# Patient Record
Sex: Male | Born: 1995 | Race: White | Hispanic: No | Marital: Single | Smoking: Never smoker
Health system: Southern US, Community
[De-identification: ages and names within clinical notes are randomized; demographics above are authoritative.]

## PROBLEM LIST (undated history)

## (undated) HISTORY — PX: OTHER SURGICAL HISTORY: SHX169

---
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
## 2014-10-25 DIAGNOSIS — R1031 Right lower quadrant pain: Secondary | ICD-10-CM | POA: Insufficient documentation

## 2014-10-26 ENCOUNTER — Encounter (HOSPITAL_COMMUNITY): Payer: Self-pay

## 2014-10-26 ENCOUNTER — Emergency Department (HOSPITAL_COMMUNITY)
Admission: EM | Admit: 2014-10-26 | Discharge: 2014-10-26 | Disposition: A | Discharge status: Home / Self Care | Payer: Self-pay | Attending: Emergency Medicine | Admitting: Emergency Medicine

## 2014-10-26 ENCOUNTER — Emergency Department (HOSPITAL_COMMUNITY): Payer: Self-pay

## 2014-10-26 DIAGNOSIS — R109 Unspecified abdominal pain: Secondary | ICD-10-CM

## 2014-10-26 LAB — CBC WITH DIFFERENTIAL/PLATELET
BASOS ABS: 0 10*3/uL (ref 0.0–0.1)
Basophils Relative: 0 %
EOS PCT: 1 %
Eosinophils Absolute: 0.2 10*3/uL (ref 0.0–0.7)
HCT: 44.9 % (ref 39.0–52.0)
Hemoglobin: 15.5 g/dL (ref 13.0–17.0)
LYMPHS PCT: 11 %
Lymphs Abs: 1.8 10*3/uL (ref 0.7–4.0)
MCH: 31.6 pg (ref 26.0–34.0)
MCHC: 34.5 g/dL (ref 30.0–36.0)
MCV: 91.4 fL (ref 78.0–100.0)
Monocytes Absolute: 1.2 10*3/uL — ABNORMAL HIGH (ref 0.1–1.0)
Monocytes Relative: 7 %
Neutro Abs: 13.2 10*3/uL — ABNORMAL HIGH (ref 1.7–7.7)
Neutrophils Relative %: 81 %
PLATELETS: 251 10*3/uL (ref 150–400)
RBC: 4.91 MIL/uL (ref 4.22–5.81)
RDW: 12.5 % (ref 11.5–15.5)
WBC: 16.4 10*3/uL — ABNORMAL HIGH (ref 4.0–10.5)

## 2014-10-26 LAB — I-STAT CHEM 8, ED
BUN: 15 mg/dL (ref 6–20)
CHLORIDE: 99 mmol/L — AB (ref 101–111)
Calcium, Ion: 1.22 mmol/L (ref 1.12–1.23)
Creatinine, Ser: 1.1 mg/dL (ref 0.61–1.24)
Glucose, Bld: 119 mg/dL — ABNORMAL HIGH (ref 65–99)
HCT: 49 % (ref 39.0–52.0)
Hemoglobin: 16.7 g/dL (ref 13.0–17.0)
POTASSIUM: 4.8 mmol/L (ref 3.5–5.1)
Sodium: 138 mmol/L (ref 135–145)
TCO2: 31 mmol/L (ref 0–100)

## 2014-10-26 LAB — URINALYSIS, ROUTINE W REFLEX MICROSCOPIC
Bilirubin Urine: NEGATIVE
Glucose, UA: NEGATIVE mg/dL
Hgb urine dipstick: NEGATIVE
KETONES UR: NEGATIVE mg/dL
LEUKOCYTES UA: NEGATIVE
NITRITE: NEGATIVE
PROTEIN: NEGATIVE mg/dL
Specific Gravity, Urine: 1.023 (ref 1.005–1.030)
UROBILINOGEN UA: 1 mg/dL (ref 0.0–1.0)
pH: 6.5 (ref 5.0–8.0)

## 2014-10-26 LAB — AMYLASE: AMYLASE: 91 U/L (ref 28–100)

## 2014-10-26 LAB — LIPASE, BLOOD: LIPASE: 18 U/L — AB (ref 22–51)

## 2014-10-26 MED ORDER — IOHEXOL 300 MG/ML  SOLN
50.0000 mL | Freq: Once | INTRAMUSCULAR | Status: AC | PRN
  Administered 2014-10-26: 50 mL via ORAL

## 2014-10-26 MED ORDER — SODIUM CHLORIDE 0.9 % IV SOLN
1000.0000 mL | INTRAVENOUS | Status: DC
Start: 2014-10-26 — End: 2014-10-26
  Administered 2014-10-26: 1000 mL via INTRAVENOUS

## 2014-10-26 MED ORDER — SODIUM CHLORIDE 0.9 % IV SOLN
1000.0000 mL | Freq: Once | INTRAVENOUS | Status: AC
  Administered 2014-10-26: 1000 mL via INTRAVENOUS

## 2014-10-26 MED ORDER — IOHEXOL 300 MG/ML  SOLN
100.0000 mL | Freq: Once | INTRAMUSCULAR | Status: AC | PRN
  Administered 2014-10-26: 100 mL via INTRAVENOUS

## 2014-10-26 MED ORDER — ONDANSETRON 8 MG PO TBDP: 8.0000 mg | ORAL_TABLET | Freq: Three times a day (TID) | ORAL | Status: AC | PRN

## 2014-10-26 MED ORDER — DICYCLOMINE HCL 20 MG PO TABS: 20.0000 mg | ORAL_TABLET | Freq: Three times a day (TID) | ORAL | Status: AC | PRN

## 2014-10-26 MED ORDER — MORPHINE SULFATE (PF) 4 MG/ML IV SOLN
4.0000 mg | INTRAVENOUS | Status: DC | PRN
  Administered 2014-10-26: 4 mg via INTRAVENOUS
  Filled 2014-10-26: qty 1

## 2014-10-26 NOTE — ED Provider Notes (Signed)
CSN: [id number]     Arrival date & time 10/25/14  2353 History  This chart was scribed for Azalia Bilis, MD by Budd Palmer, ED Scribe. This patient was seen in room WA15/WA15 and the patient's care was started at 2:46 AM.    Chief Complaint  Patient presents with  . Abdominal Pain   The history is provided by the patient. No language interpreter was used.   HPI Comments: Jeffrey Harvey is a 19 y.o. male who presents to the Emergency Department complaining of sharp, burning, intermittent RLQ abdominal pain onset at 6 PM. He currently rates his pain as a 1/10. He reports associated nausea. He notes a previous episode 2 weeks ago of the same where he vomited, had diarrhea, and then resolved after 3 hours. Pt denies any FHx of inflammatory bowel disease . He has not found any triggers for this pain. Pt denies fever and urinary symptoms.  History reviewed. No pertinent past medical history. History reviewed. No pertinent past surgical history. History reviewed. No pertinent family history. Social History  Substance Use Topics  . Smoking status: Never Smoker   . Smokeless tobacco: None  . Alcohol Use: No    Review of Systems  A complete 10 system review of systems was obtained and all systems are negative except as noted in the HPI and PMH.    Allergies  Review of patient's allergies indicates not on file.  Home Medications   Prior to Admission medications   Not on File   BP 124/69 mmHg  Pulse 70  Temp(Src) 98 F (36.7 C) (Oral)  Resp 16  SpO2 100% Physical Exam  Constitutional: He is oriented to person, place, and time. He appears well-developed and well-nourished.  HENT:  Head: Normocephalic and atraumatic.  Eyes: EOM are normal.  Neck: Normal range of motion.  Cardiovascular: Normal rate, regular rhythm, normal heart sounds and intact distal pulses.   Pulmonary/Chest: Effort normal and breath sounds normal. No respiratory distress.  Abdominal: Soft. He exhibits no  distension. There is tenderness.  Mild suprapubic and RLQ TTP  Musculoskeletal: Normal range of motion.  Neurological: He is alert and oriented to person, place, and time.  Skin: Skin is warm and dry.  Psychiatric: He has a normal mood and affect. Judgment normal.  Nursing note and vitals reviewed.   ED Course  Procedures  DIAGNOSTIC STUDIES: Oxygen Saturation is 100% on RA, normal by my interpretation.    COORDINATION OF CARE: 2:51 AM - Discussed plans to order diagnostic imaging. Pt advised of plan for treatment and pt agrees.  Labs Review Labs Reviewed  CBC WITH DIFFERENTIAL/PLATELET - Abnormal; Notable for the following:    WBC 16.4 (*)    Neutro Abs 13.2 (*)    Monocytes Absolute 1.2 (*)    All other components within normal limits  LIPASE, BLOOD - Abnormal; Notable for the following:    Lipase 18 (*)    All other components within normal limits  I-STAT CHEM 8, ED - Abnormal; Notable for the following:    Chloride 99 (*)    Glucose, Bld 119 (*)    All other components within normal limits  AMYLASE  URINALYSIS, ROUTINE W REFLEX MICROSCOPIC (NOT AT [facility])    Imaging Review Ct Abdomen Pelvis W Contrast  10/26/2014   CLINICAL DATA:  Right lower quadrant pain, nausea, leukocytosis  EXAM: CT ABDOMEN AND PELVIS WITH CONTRAST  TECHNIQUE: Multidetector CT imaging of the abdomen and pelvis was performed using the standard protocol  following bolus administration of intravenous contrast.  CONTRAST:  OMNIPAQUE IOHEXOL 300 MG/ML  SOLN  COMPARISON:  None.  FINDINGS: Lower chest:  Lung bases are clear.  Hepatobiliary: Liver is within normal limits. No suspicious/enhancing hepatic lesions.  Gallbladder is unremarkable. No intrahepatic or extrahepatic ductal dilatation.  Pancreas: Within normal limits.  Spleen: Within normal limits.  Adrenals/Urinary Tract: Adrenal glands are within normal limits.  Kidneys are within normal limits.  No hydronephrosis.  Bladder is within normal limits.   Stomach/Bowel: Stomach is within normal limits.  No evidence of bowel obstruction.  Appendix is notable for mild mucosal hyperenhancement of the wall (coronal image 46) without associated inflammatory changes.  Moderate colonic stool burden.  Vascular/Lymphatic: No evidence of abdominal aortic aneurysm.  No suspicious abdominopelvic lymphadenopathy.  Reproductive: Prostate is unremarkable.  Other: Small volume pelvic ascites.  Musculoskeletal: Visualized osseous structures are within normal limits.  IMPRESSION: No evidence of bowel obstruction.  No evidence of acute appendicitis.  Small volume pelvic ascites, nonspecific.   Electronically Signed   By: Charline Bills M.D.   On: 10/26/2014 06:23   I have personally reviewed and evaluated these images and lab results as part of my medical decision-making.   EKG Interpretation None      MDM   Final diagnoses:  None    Outpatient GI follow-up as the patient may benefit from outpatient colonoscopy given his ongoing intermittent lower abdominal pain.  No urinary symptoms.  Well-appearing.  Vital signs normal.  I personally performed the services described in this documentation, which was scribed in my presence. The recorded information has been reviewed and is accurate.      Azalia Bilis, MD 10/26/14 [phone]

## 2014-10-26 NOTE — ED Notes (Signed)
Pt complains of sharp abd pains in his stomach tonight, no vomiting or diarrhea

## 2016-04-04 IMAGING — CT CT ABD-PELV W/ CM
2 of 4 series · 16 of 46 positions shown, 18 images · IV contrast (omnipaque)
Comparison: None.

CLINICAL DATA: Right lower quadrant pain, nausea, leukocytosis

EXAM:
CT ABDOMEN AND PELVIS WITH CONTRAST
TECHNIQUE: Multidetector CT imaging of the abdomen and pelvis was performed
using the standard protocol following bolus administration of
intravenous contrast.
CONTRAST:  100mL OMNIPAQUE IOHEXOL 300 MG/ML  SOLN

[Series 2: abd/pel with · axial · 0.70mm/px · z∈[+1018,+1403]mm · 13 of 85 slices shown, 15 images]
[im 4/85  soft-tissue]
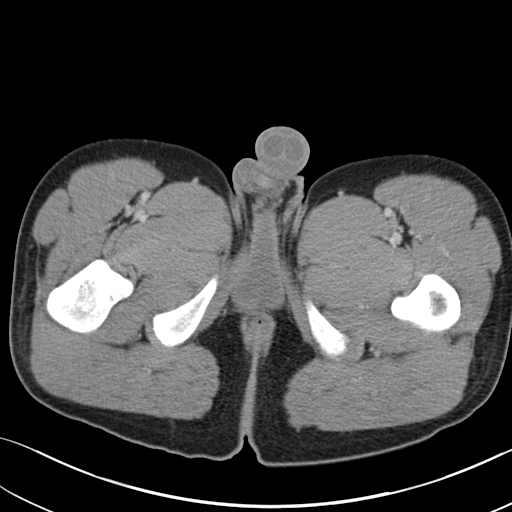
[im 4/85  bone]
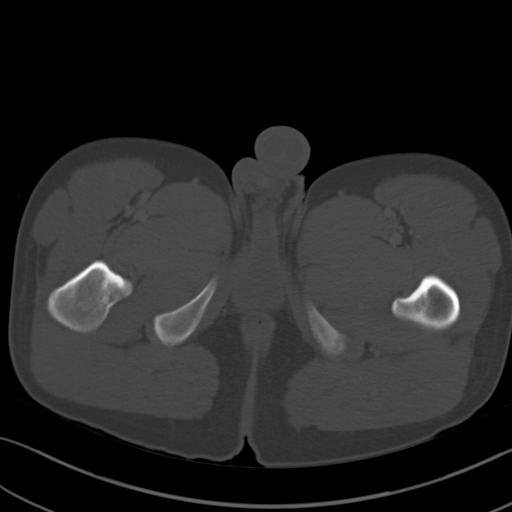
[im 10/85  soft-tissue]
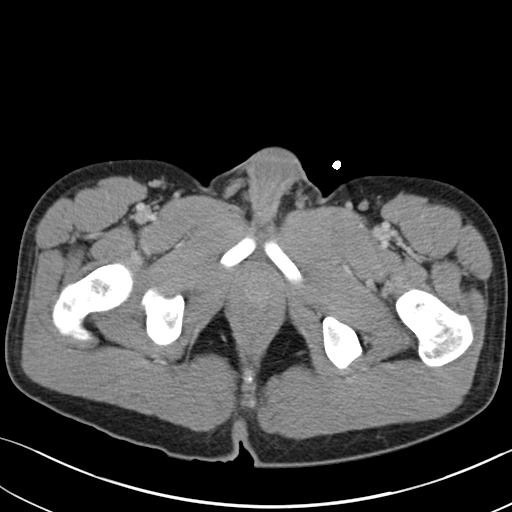
[im 17/85  soft-tissue]
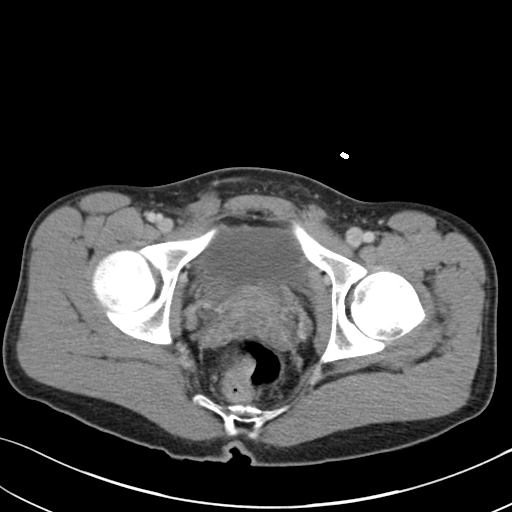
[im 23/85  soft-tissue]
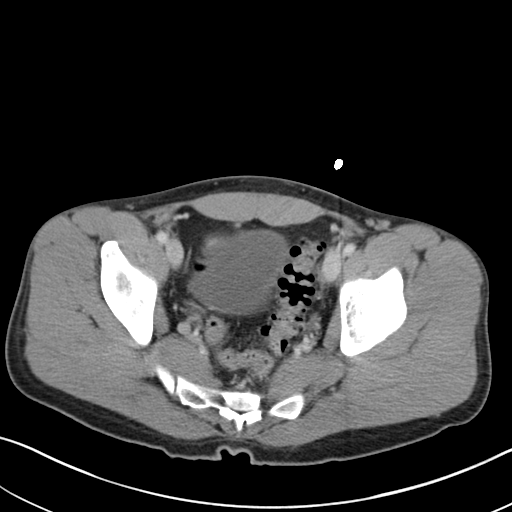
[im 30/85  soft-tissue]
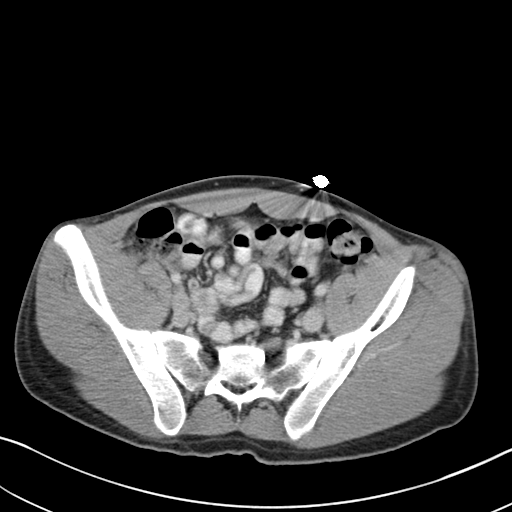
[im 36/85  soft-tissue]
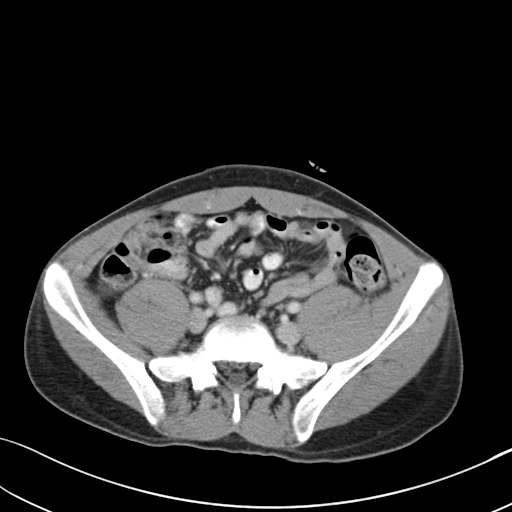
[im 43/85  soft-tissue]
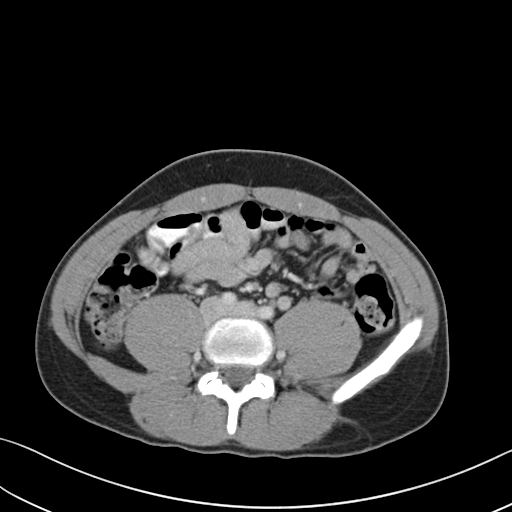
[im 49/85  soft-tissue]
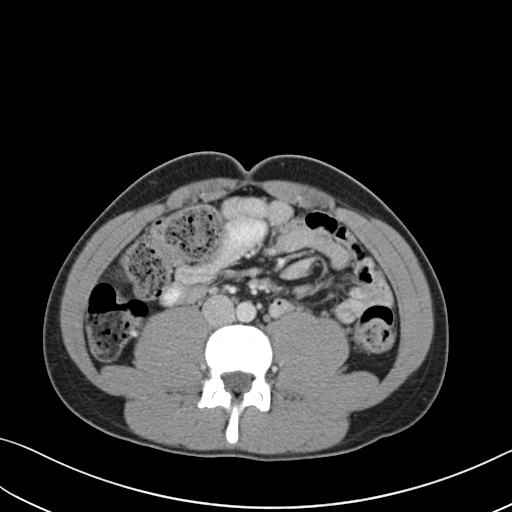
[im 55/85  soft-tissue]
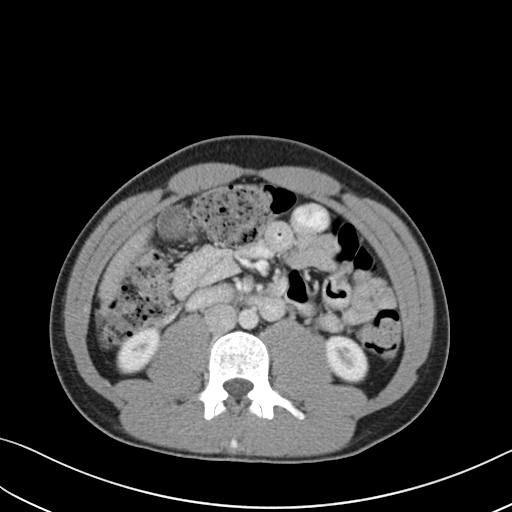
[im 55/85  bone]
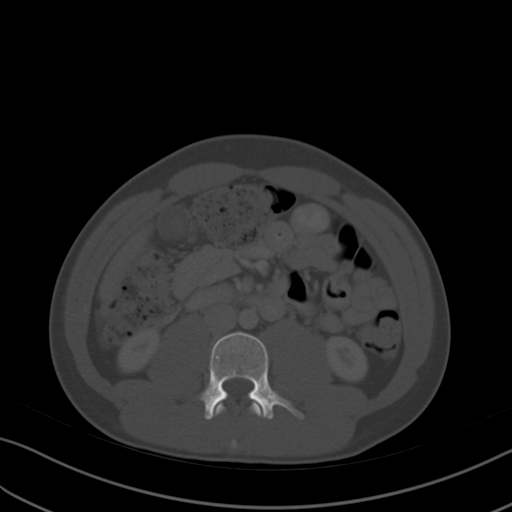
[im 62/85  soft-tissue]
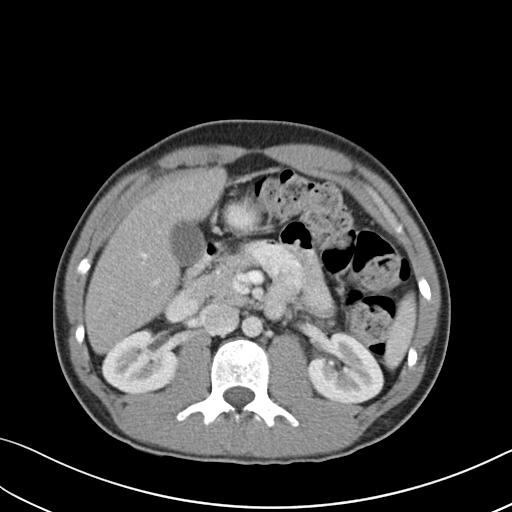
[im 68/85  soft-tissue]
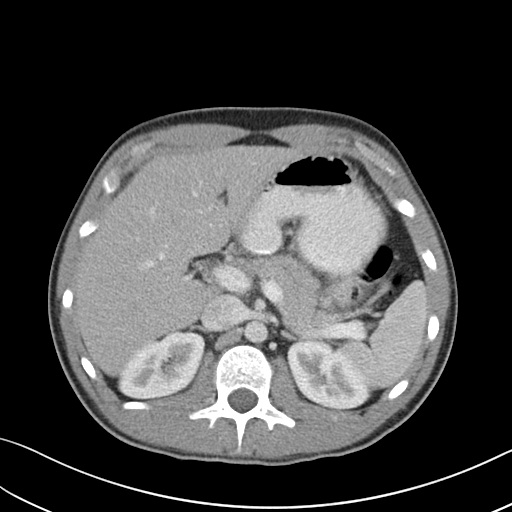
[im 75/85  soft-tissue]
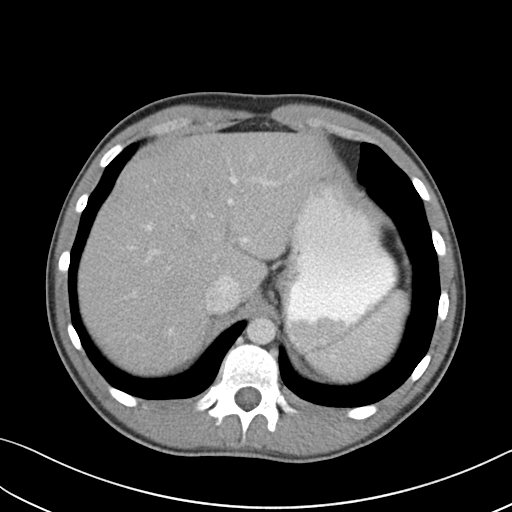
[im 81/85  soft-tissue]
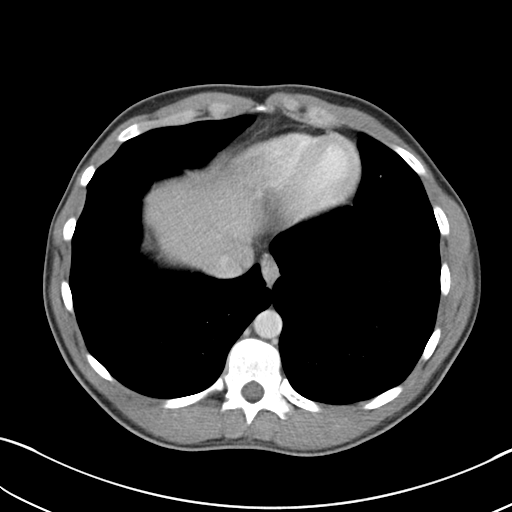

[Series 4: coronal a/|p · coronal · 0.62mm/px · 3 of 86 slices shown]
[im 29/86  soft-tissue]
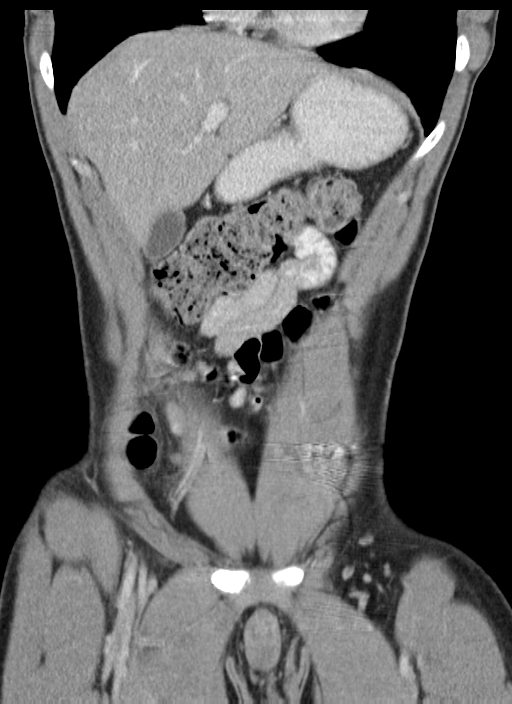
[im 38/86  soft-tissue]
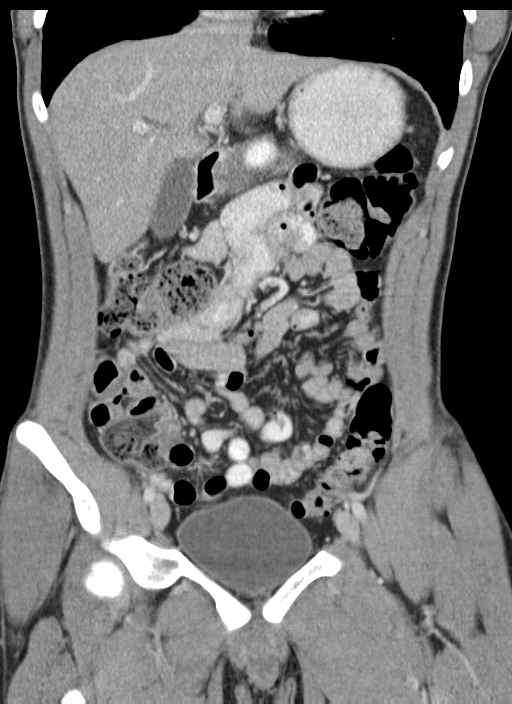
[im 48/86  soft-tissue]
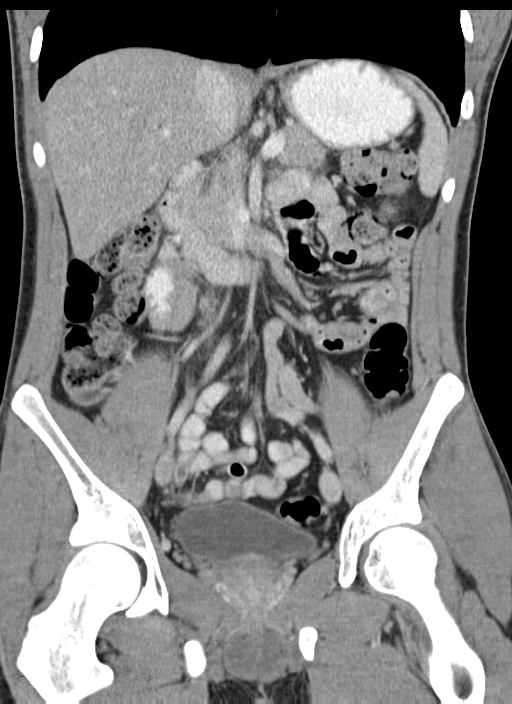

[16 of 46 positions shown; findings below may reference images not displayed]

FINDINGS: Lower chest:  Lung bases are clear.

Hepatobiliary: Liver is within normal limits. No
suspicious/enhancing hepatic lesions.

Gallbladder is unremarkable. No intrahepatic or extrahepatic ductal
dilatation.

Pancreas: Within normal limits.

Spleen: Within normal limits.

Adrenals/Urinary Tract: Adrenal glands are within normal limits.

Kidneys are within normal limits.  No hydronephrosis.

Bladder is within normal limits.

Stomach/Bowel: Stomach is within normal limits.

No evidence of bowel obstruction.

Appendix is notable for mild mucosal hyperenhancement of the wall
(coronal image 46) without associated inflammatory changes.

Moderate colonic stool burden.

Vascular/Lymphatic: No evidence of abdominal aortic aneurysm.

No suspicious abdominopelvic lymphadenopathy.

Reproductive: Prostate is unremarkable.

Other: Small volume pelvic ascites.

Musculoskeletal: Visualized osseous structures are within normal
limits.
IMPRESSION: No evidence of bowel obstruction.

No evidence of acute appendicitis.

Small volume pelvic ascites, nonspecific.

## 2016-06-06 ENCOUNTER — Emergency Department (HOSPITAL_COMMUNITY)
Admission: EM | Admit: 2016-06-06 | Discharge: 2016-06-06 | Disposition: A | Discharge status: Home / Self Care | Payer: Self-pay | Attending: Emergency Medicine | Admitting: Emergency Medicine

## 2016-06-06 ENCOUNTER — Encounter (HOSPITAL_COMMUNITY): Payer: Self-pay | Admitting: *Deleted

## 2016-06-06 DIAGNOSIS — R112 Nausea with vomiting, unspecified: Secondary | ICD-10-CM | POA: Insufficient documentation

## 2016-06-06 DIAGNOSIS — R1031 Right lower quadrant pain: Secondary | ICD-10-CM | POA: Insufficient documentation

## 2016-06-06 DIAGNOSIS — R1030 Lower abdominal pain, unspecified: Secondary | ICD-10-CM

## 2016-06-06 LAB — URINALYSIS, ROUTINE W REFLEX MICROSCOPIC
BACTERIA UA: NONE SEEN
Bilirubin Urine: NEGATIVE
GLUCOSE, UA: NEGATIVE mg/dL
Hgb urine dipstick: NEGATIVE
Ketones, ur: 20 mg/dL — AB
Leukocytes, UA: NEGATIVE
Nitrite: NEGATIVE
PROTEIN: 100 mg/dL — AB
Specific Gravity, Urine: 1.028 (ref 1.005–1.030)
Squamous Epithelial / LPF: NONE SEEN
pH: 8 (ref 5.0–8.0)

## 2016-06-06 LAB — CBC
HEMATOCRIT: 47.5 % (ref 39.0–52.0)
Hemoglobin: 16.8 g/dL (ref 13.0–17.0)
MCH: 31.6 pg (ref 26.0–34.0)
MCHC: 35.4 g/dL (ref 30.0–36.0)
MCV: 89.5 fL (ref 78.0–100.0)
Platelets: 239 10*3/uL (ref 150–400)
RBC: 5.31 MIL/uL (ref 4.22–5.81)
RDW: 12.5 % (ref 11.5–15.5)
WBC: 15.4 10*3/uL — ABNORMAL HIGH (ref 4.0–10.5)

## 2016-06-06 LAB — COMPREHENSIVE METABOLIC PANEL
ALBUMIN: 5 g/dL (ref 3.5–5.0)
ALK PHOS: 83 U/L (ref 38–126)
ALT: 83 U/L — ABNORMAL HIGH (ref 17–63)
AST: 53 U/L — AB (ref 15–41)
Anion gap: 12 (ref 5–15)
BILIRUBIN TOTAL: 1.3 mg/dL — AB (ref 0.3–1.2)
BUN: 14 mg/dL (ref 6–20)
CALCIUM: 10.4 mg/dL — AB (ref 8.9–10.3)
CO2: 22 mmol/L (ref 22–32)
Chloride: 102 mmol/L (ref 101–111)
Creatinine, Ser: 1.26 mg/dL — ABNORMAL HIGH (ref 0.61–1.24)
GFR calc Af Amer: >60 mL/min (ref >60)
GFR calc non Af Amer: >60 mL/min (ref >60)
GLUCOSE: 145 mg/dL — AB (ref 65–99)
POTASSIUM: 4 mmol/L (ref 3.5–5.1)
Sodium: 136 mmol/L (ref 135–145)
Total Protein: 8.1 g/dL (ref 6.5–8.1)

## 2016-06-06 LAB — LIPASE, BLOOD: Lipase: 19 U/L (ref 11–51)

## 2016-06-06 MED ORDER — ONDANSETRON 4 MG PO TBDP
ORAL_TABLET | ORAL | Status: AC
  Filled 2016-06-06: qty 1

## 2016-06-06 MED ORDER — SODIUM CHLORIDE 0.9 % IV BOLUS (SEPSIS)
1000.0000 mL | Freq: Once | INTRAVENOUS | Status: AC
  Administered 2016-06-06: 1000 mL via INTRAVENOUS

## 2016-06-06 MED ORDER — ONDANSETRON 4 MG PO TBDP
4.0000 mg | ORAL_TABLET | Freq: Once | ORAL | Status: AC | PRN
  Administered 2016-06-06: 4 mg via ORAL

## 2016-06-06 NOTE — ED Triage Notes (Signed)
Pt reprots waking up this am with severe abd pain and n/v. Denies diarrhea. Reports syncopal episode after vomiting.

## 2016-06-06 NOTE — Discharge Instructions (Signed)
Increase hydration.   Call the GI clinic today or tomorrow morning to schedule a follow up appointment.   Please seek immediate care if you develop any of the following symptoms: The pain does not go away.  You have a fever.  You keep throwing up and can't keep fluids down. You pass bloody or black tarry stools.  There is bright red blood in the stool.  There is rectal pain.  You do not seem to be getting better.  You have any questions or concerns.

## 2016-06-06 NOTE — ED Provider Notes (Signed)
[facility]-EMERGENCY DEPT Provider Note   CSN: [id number] Arrival date & time: 06/06/16  4098     History   Chief Complaint Chief Complaint  Patient presents with  . Emesis    HPI Jeffrey Harvey is a 21 y.o. male.  The history is provided by the patient and medical records. No language interpreter was used.   Jeffrey Harvey is an otherwise healthy 21 y.o. male who presents to the Emergency Department complaining of right-sided sharp abdominal pain associated with nausea and vomiting which began today. Patient states pain awoke him from his sleep today around 2am. He had several episodes of emesis and felt nauseous all morning. Throughout the morning, pain slowly improved. Took no medications at home prior to arrival, however was given Zofran in triage. Upon my evaluation, patient states that pain has now completely resolved after Zofran administration. He is no longer nauseous. No fevers, chest pain, cough, congestion. No diarrhea, constipation, blood in stool. No sick contacts or recent traveling. History of similar episodes a few years ago where he received CT scan and was told everything was fine. He was instructed to follow up with GI, however symptoms resolved and therefore he did not follow up.   History reviewed. No pertinent past medical history.  There are no active problems to display for this patient.   History reviewed. No pertinent surgical history.     Home Medications    Prior to Admission medications   Medication Sig Start Date End Date Taking? Authorizing Provider  acetaminophen (TYLENOL) 500 MG tablet Take 1,000 mg by mouth every 6 (six) hours as needed (for pain.).    Historical Provider, MD  dicyclomine (BENTYL) 20 MG tablet Take 1 tablet (20 mg total) by mouth every 8 (eight) hours as needed for spasms. Patient not taking: Reported on 06/06/2016 10/26/14   Azalia Bilis, MD  ondansetron [facility] ODT) 8 MG disintegrating tablet Take 1 tablet (8 mg total) by  mouth every 8 (eight) hours as needed for nausea or vomiting. Patient not taking: Reported on 06/06/2016 10/26/14   Azalia Bilis, MD    Family History History reviewed. No pertinent family history.  Social History Social History  Substance Use Topics  . Smoking status: Never Smoker  . Smokeless tobacco: Not on file  . Alcohol use No     Allergies   Lactose intolerance (gi)   Review of Systems Review of Systems  Gastrointestinal: Positive for abdominal pain, nausea and vomiting. Negative for blood in stool, constipation and diarrhea.  All other systems reviewed and are negative.    Physical Exam Updated Vital Signs BP 124/82 (BP Location: Right Arm)   Pulse 75   Temp 97.8 F (36.6 C) (Oral)   Resp 16   SpO2 96%   Physical Exam  Constitutional: He is oriented to person, place, and time. He appears well-developed and well-nourished. No distress.  HENT:  Head: Normocephalic and atraumatic.  Tacky mucous membranes.  Cardiovascular: Normal rate, regular rhythm and normal heart sounds.   No murmur heard. Pulmonary/Chest: Effort normal and breath sounds normal. No respiratory distress.  Abdominal: Soft. Bowel sounds are normal. He exhibits no distension.  No abdominal or CVA tenderness.  Musculoskeletal: Normal range of motion.  Neurological: He is alert and oriented to person, place, and time.  Skin: Skin is warm and dry.  Good cap refill.  Nursing note and vitals reviewed.    ED Treatments / Results  Labs (all labs ordered are listed, but only abnormal results are  displayed) Labs Reviewed  COMPREHENSIVE METABOLIC PANEL - Abnormal; Notable for the following:       Result Value   Glucose, Bld 145 (*)    Creatinine, Ser 1.26 (*)    Calcium 10.4 (*)    AST 53 (*)    ALT 83 (*)    Total Bilirubin 1.3 (*)    All other components within normal limits  CBC - Abnormal; Notable for the following:    WBC 15.4 (*)    All other components within normal limits    URINALYSIS, ROUTINE W REFLEX MICROSCOPIC - Abnormal; Notable for the following:    Color, Urine AMBER (*)    APPearance HAZY (*)    Ketones, ur 20 (*)    Protein, ur 100 (*)    All other components within normal limits  LIPASE, BLOOD    EKG  EKG Interpretation None       Radiology No results found.  Procedures Procedures (including critical care time)  Medications Ordered in ED Medications  ondansetron (ZOFRAN-ODT) 4 MG disintegrating tablet (not administered)  ondansetron (ZOFRAN-ODT) disintegrating tablet 4 mg (4 mg Oral Given 06/06/16 0934)  sodium chloride 0.9 % bolus 1,000 mL (0 mLs Intravenous Stopped 06/06/16 1252)     Initial Impression / Assessment and Plan / ED Course  I have reviewed the triage vital signs and the nursing notes.  Pertinent labs & imaging results that were available during my care of the patient were reviewed by me and considered in my medical decision making (see chart for details).    Jeffrey Harvey is a 21 y.o. male who presents to ED for right sided abdominal pain which began around 2 am. Associated with n/v. Patient notes all symptoms have resolved just after zofran administration. Benign abdominal exam. Does appear mildly dehydrated. 1L IVF given. Labs reviewed with attending and notable for mild bump in AST/ALT/Bili as well as leukocytosis of 15.4. Cr up to 1.26 - suspect this is due to dehydration.   Patient re-evaluated following fluids. Repeat abdominal exam still benign with no tenderness whatsoever. Patient feels improved with no return of symptoms, nausea or vomiting. Eating graham crackers and peanut butter with no difficulty. He has history of very similar incident 2 years ago where CT scan was performed and negative. He will have intermittent symptoms similar to this, but not as severe. Strongly encouraged GI follow up. Spoke to patient as well as mother and significant other about return precautions. They feel comfortable with plan  and understand reasons to return. All questions answered.    Patient discussed with Dr. Patria Mane who agrees with treatment plan.    Final Clinical Impressions(s) / ED Diagnoses   Final diagnoses:  Lower abdominal pain    New Prescriptions New Prescriptions   No medications on file     [facility] Ward, PA-C 06/06/16 1313    Azalia Bilis, MD 06/06/16 1539

## 2018-10-26 ENCOUNTER — Other Ambulatory Visit: Payer: Self-pay

## 2018-10-26 ENCOUNTER — Emergency Department (HOSPITAL_COMMUNITY)
Admission: EM | Admit: 2018-10-26 | Discharge: 2018-10-26 | Disposition: A | Discharge status: Home / Self Care | Payer: Self-pay | Attending: Emergency Medicine | Admitting: Emergency Medicine

## 2018-10-26 DIAGNOSIS — R3989 Other symptoms and signs involving the genitourinary system: Secondary | ICD-10-CM | POA: Insufficient documentation

## 2018-10-26 DIAGNOSIS — N4889 Other specified disorders of penis: Secondary | ICD-10-CM | POA: Insufficient documentation

## 2018-10-26 MED ORDER — AZITHROMYCIN 250 MG PO TABS
1000.0000 mg | ORAL_TABLET | Freq: Once | ORAL | Status: AC
  Administered 2018-10-26: 1000 mg via ORAL
  Filled 2018-10-26: qty 4

## 2018-10-26 MED ORDER — LIDOCAINE-PRILOCAINE 2.5-2.5 % EX CREA: 1.0000 "application " | TOPICAL_CREAM | CUTANEOUS | 0 refills | Status: AC | PRN

## 2018-10-26 MED ORDER — BACITRACIN ZINC 500 UNIT/GM EX OINT
TOPICAL_OINTMENT | Freq: Once | CUTANEOUS | Status: AC
  Administered 2018-10-26: 15:00:00 via TOPICAL
  Filled 2018-10-26: qty 0.9

## 2018-10-26 MED ORDER — BACITRACIN ZINC 500 UNIT/GM EX OINT: 1.0000 "application " | TOPICAL_OINTMENT | Freq: Two times a day (BID) | CUTANEOUS | 0 refills | Status: AC

## 2018-10-26 NOTE — ED Provider Notes (Signed)
[facility] EMERGENCY DEPARTMENT Provider Note   CSN: [id number] Arrival date & time: 10/26/18  1228     History   Chief Complaint No chief complaint on file.   HPI Jeffrey Harvey is a 23 y.o. male presents today for evaluation of acute onset, progressively worsening lesion to the penis for around 2 weeks.  He reports thinking that it was initially a spider bite.  The area initially presented as a "small white head "to which she applied warm compresses and it eventually drained purulent material.  He states that since then he is developed a wound that has gotten larger and he has some surrounding erythema and induration.  He does not really have any discomfort unless something brushes up against the area and then it is tender and irritated.  Has been applying hydrogen peroxide without relief.  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  States his significant other with whom he is exclusively sexually active with but does not use protection does not have any similar symptoms, but she did have a spider bite to her leg which resolved.  He denies abdominal pain, nausea, vomiting, urinary symptoms, testicular pain, or scrotal swelling. Has not been tested for STDs in some years he reports.  No known exposures to any STDs.     The history is provided by the patient.    No past medical history on file.  There are no active problems to display for this patient.   No past surgical history on file.      Home Medications    Prior to Admission medications   Medication Sig Start Date End Date Taking? Authorizing Provider  acetaminophen (TYLENOL) 500 MG tablet Take 1,000 mg by mouth every 6 (six) hours as needed (for pain.).    [provider]  bacitracin ointment Apply 1 application topically 2 (two) times daily. 10/26/18   Clemons Salvucci A, PA-C  dicyclomine (BENTYL) 20 MG tablet Take 1 tablet (20 mg total) by mouth every 8 (eight) hours as needed for  spasms. Patient not taking: Reported on 06/06/2016 10/26/14   Azalia Bilis, MD  lidocaine-prilocaine (EMLA) cream Apply 1 application topically as needed. 10/26/18   Luevenia Maxin, Toryn Mcclinton A, PA-C  ondansetron (ZOFRAN ODT) 8 MG disintegrating tablet Take 1 tablet (8 mg total) by mouth every 8 (eight) hours as needed for nausea or vomiting. Patient not taking: Reported on 06/06/2016 10/26/14   Azalia Bilis, MD    Family History No family history on file.  Social History Social History   Tobacco Use   Smoking status: Never Smoker  Substance Use Topics   Alcohol use: No   Drug use: No     Allergies   Lactose intolerance (gi)   Review of Systems Review of Systems  Constitutional: Negative for chills and fever.  Gastrointestinal: Negative for abdominal pain, diarrhea, nausea and vomiting.  Genitourinary: Positive for penile pain. Negative for dysuria, frequency, hematuria and urgency.     Physical Exam Updated Vital Signs BP 139/88 (BP Location: Right Arm)    Pulse (!) 110    Temp 98 F (36.7 C) (Oral)    Resp 16    SpO2 100%   Physical Exam Vitals signs and nursing note reviewed.  Constitutional:      General: He is not in acute distress.    Appearance: He is well-developed.  HENT:     Head: Normocephalic and atraumatic.  Eyes:     General:  Right eye: No discharge.        Left eye: No discharge.     Conjunctiva/sclera: Conjunctivae normal.  Neck:     Vascular: No JVD.     Trachea: No tracheal deviation.  Cardiovascular:     Rate and Rhythm: Normal rate.  Pulmonary:     Effort: Pulmonary effort is normal.  Abdominal:     General: Abdomen is flat. There is no distension.     Palpations: Abdomen is soft.     Tenderness: There is no abdominal tenderness. There is no right CVA tenderness, left CVA tenderness, guarding or rebound.  Genitourinary:    Comments: Examination performed in the presence of a chaperone.  Patient has a single 1 cm ulceration to the shaft of the  penis with mild surrounding erythema and induration.  Mild tenderness to palpation of lesion.  No urethral discharge, no testicular pain or scrotal swelling. Skin:    General: Skin is warm and dry.     Findings: No erythema.  Neurological:     Mental Status: He is alert.  Psychiatric:        Behavior: Behavior normal.      ED Treatments / Results  Labs (all labs ordered are listed, but only abnormal results are displayed) Labs Reviewed  RPR  HIV ANTIBODY (ROUTINE TESTING W REFLEX)    EKG None  Radiology No results found.  Procedures Procedures (including critical care time)  Medications Ordered in ED Medications  azithromycin (ZITHROMAX) tablet 1,000 mg (has no administration in time range)  bacitracin ointment (has no administration in time range)     Initial Impression / Assessment and Plan / ED Course  I have reviewed the triage vital signs and the nursing notes.  Pertinent labs & imaging results that were available during my care of the patient were reviewed by me and considered in my medical decision making (see chart for details).        Patient presenting for evaluation of painful genital lesion for around 1 week.  Started as what sounds like a small whitehead that began to drain and has since worsened.  He is afebrile, initially tachycardic on triage but is not tachycardic on my assessment but does appears some what anxious.  Nontoxic in appearance.  Abdomen soft and nontender, no CVA tenderness.  He has no urinary symptoms. He is low risk for STDs overall, reports he is monogamous.  He is a single ulceration to the penile shaft that is tender to palpation with no drainage, mild surrounding erythema.  Differential includes syphilitic chancre although less likely as this is typically painless, venereum inguinale, or wound secondary to spider bite as patient suspects.  Doubt herpes as he is only one single solitary lesion.  He is hesitant to do any sort of extended  work-up as he does not have health insurance and does not want any unnecessary testing or treatment.  We discussed that the health department does testing and treatment for STIs for free and he states he would like to follow-up with him.  However, given the appearance of the lesion, would recommend obtaining HIV and syphilis testing.  We will give azithromycin 1 g per CDC recommendations for treatment of chancre.  He has no urinary symptoms and does not wish to be tested for gonorrhea chlamydia at this time.  He understands to follow-up with the health department for reevaluation of symptoms.  Discussed wound care.  Discussed strict ED return precautions. Patient verbalized understanding of and  agreement with plan and is safe for discharge home at this time.   Final Clinical Impressions(s) / ED Diagnoses   Final diagnoses:  Genital sore    ED Discharge Orders         Ordered    lidocaine-prilocaine (EMLA) cream  As needed     10/26/18 1432    bacitracin ointment  2 times daily     10/26/18 1432           Renita Papa, PA-C 10/26/18 1504    Maudie Flakes, MD 10/27/18 1656

## 2018-10-26 NOTE — ED Notes (Signed)
Patient verbalizes understanding of discharge instructions. Opportunity for questioning and answers were provided. Armband removed by staff, pt discharged from ED. Ambulated out to lobby  

## 2018-10-26 NOTE — ED Triage Notes (Signed)
Pt here for evaluation of spider bite on his penis x 1.5 weeks. Pt sts it has been getting worse, now the size of a dime. Sts there is a yellow layer and the area around is red with oozing onto the bandaid.

## 2018-10-26 NOTE — Discharge Instructions (Addendum)
Apply topical antibiotic ointment and small amount of numbing cream to the area daily.  Apply nonadherent gauze then cover the area or securely tape it.  Keep it clean and dry.  The remainder of your tests will result within about 48 hours.  You will receive a phone call if any of your blood work is abnormal, you will not receive a phone call if any of your blood work is normal.  I would recommend following up with the health department for any further testing or treatment and for reevaluation to ensure that this resolves.  They do testing and treatment for free.  Return to the emergency department if any concerning signs or symptoms develop such as high fevers, persistent vomiting, abdominal pain.

## 2018-10-27 LAB — RPR: RPR Ser Ql: NONREACTIVE

## 2018-10-27 LAB — HIV ANTIBODY (ROUTINE TESTING W REFLEX): HIV Screen 4th Generation wRfx: NONREACTIVE

## 2023-11-12 ENCOUNTER — Encounter (HOSPITAL_BASED_OUTPATIENT_CLINIC_OR_DEPARTMENT_OTHER): Payer: Self-pay | Admitting: Emergency Medicine

## 2023-11-12 ENCOUNTER — Emergency Department (HOSPITAL_BASED_OUTPATIENT_CLINIC_OR_DEPARTMENT_OTHER): Admitting: Radiology

## 2023-11-12 ENCOUNTER — Emergency Department (HOSPITAL_BASED_OUTPATIENT_CLINIC_OR_DEPARTMENT_OTHER)
Admission: EM | Admit: 2023-11-12 | Discharge: 2023-11-12 | Discharge status: Against Medical Advice | Attending: Emergency Medicine | Admitting: Emergency Medicine

## 2023-11-12 ENCOUNTER — Other Ambulatory Visit: Payer: Self-pay

## 2023-11-12 DIAGNOSIS — Y92410 Unspecified street and highway as the place of occurrence of the external cause: Secondary | ICD-10-CM | POA: Diagnosis not present

## 2023-11-12 DIAGNOSIS — Z5321 Procedure and treatment not carried out due to patient leaving prior to being seen by health care provider: Secondary | ICD-10-CM | POA: Insufficient documentation

## 2023-11-12 DIAGNOSIS — M545 Low back pain, unspecified: Secondary | ICD-10-CM | POA: Insufficient documentation

## 2023-11-12 NOTE — ED Notes (Signed)
 Pt called x 3. No longer in lobby.
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
